# Patient Record
Sex: Female | Born: 1990 | Race: Black or African American | Hispanic: No | Marital: Single | Smoking: Never smoker
Health system: Southern US, Community
[De-identification: ages and names within clinical notes are randomized; demographics above are authoritative.]

---

## 2009-06-23 ENCOUNTER — Emergency Department (HOSPITAL_COMMUNITY): Admission: EM | Admit: 2009-06-23 | Discharge: 2009-06-23 | Payer: Self-pay | Admitting: Emergency Medicine

## 2013-12-27 ENCOUNTER — Emergency Department (HOSPITAL_COMMUNITY)
Admission: EM | Admit: 2013-12-27 | Discharge: 2013-12-27 | Disposition: A | Discharge status: Home / Self Care | Payer: No Typology Code available for payment source | Attending: Emergency Medicine | Admitting: Emergency Medicine

## 2013-12-27 ENCOUNTER — Emergency Department (HOSPITAL_COMMUNITY): Payer: No Typology Code available for payment source

## 2013-12-27 ENCOUNTER — Encounter (HOSPITAL_COMMUNITY): Payer: Self-pay | Admitting: Emergency Medicine

## 2013-12-27 DIAGNOSIS — Y93C2 Activity, hand held interactive electronic device: Secondary | ICD-10-CM | POA: Diagnosis not present

## 2013-12-27 DIAGNOSIS — S298XXA Other specified injuries of thorax, initial encounter: Secondary | ICD-10-CM | POA: Diagnosis present

## 2013-12-27 DIAGNOSIS — R0781 Pleurodynia: Secondary | ICD-10-CM

## 2013-12-27 NOTE — Discharge Instructions (Signed)
Please call your doctor for a followup appointment within 24-48 hours. When you talk to your doctor please let them know that you were seen in the emergency department and have them acquire all of your records so that they can discuss the findings with you and formulate a treatment plan to fully care for your new and ongoing problems. Please rest and stay hydrated Please call and set up an appointment with your primary care provider Please call and 7 appointment with orthopedics Please rest and avoid any physical or strenuous activity Please continue to monitor symptoms closely and if symptoms are to worsen or change (fever greater than 101, chills, sweating, nausea, vomiting, chest pain, shortness of breathe, difficulty breathing, weakness, numbness, tingling, worsening or changes to pain pattern, weakness, numbness, tingling, fall, injury, headache, dizziness, blurred vision, sudden loss of vision, worsening or changes to pain pattern in the ribs) please report back to the Emergency Department immediately.    Chest Wall Pain Chest wall pain is pain in or around the bones and muscles of your chest. It may take up to 6 weeks to get better. It may take longer if you must stay physically active in your work and activities.  CAUSES  Chest wall pain may happen on its own. However, it may be caused by:  A viral illness like the flu.  Injury.  Coughing.  Exercise.  Arthritis.  Fibromyalgia.  Shingles. HOME CARE INSTRUCTIONS   Avoid overtiring physical activity. Try not to strain or perform activities that cause pain. This includes any activities using your chest or your abdominal and side muscles, especially if heavy weights are used.  Put ice on the sore area.  Put ice in a plastic bag.  Place a towel between your skin and the bag.  Leave the ice on for 15-20 minutes per hour while awake for the first 2 days.  Only take over-the-counter or prescription medicines for pain, discomfort,  or fever as directed by your caregiver. SEEK IMMEDIATE MEDICAL CARE IF:   Your pain increases, or you are very uncomfortable.  You have a fever.  Your chest pain becomes worse.  You have new, unexplained symptoms.  You have nausea or vomiting.  You feel sweaty or lightheaded.  You have a cough with phlegm (sputum), or you cough up blood. MAKE SURE YOU:   Understand these instructions.  Will watch your condition.  Will get help right away if you are not doing well or get worse. Document Released: 04/11/2005 Document Revised: 07/04/2011 Document Reviewed: 12/06/2010 [facility] Patient Information 2015 [place], [place]. This information is not intended to replace advice given to you by your health care provider. Make sure you discuss any questions you have with your health care provider.   Emergency Department Resource Guide 1) Find a Doctor and Pay Out of Pocket Although you won't have to find out who is covered by your insurance plan, it is a good idea to ask around and get recommendations. You will then need to call the office and see if the doctor you have chosen will accept you as a new patient and what types of options they offer for patients who are self-pay. Some doctors offer discounts or will set up payment plans for their patients who do not have insurance, but you will need to ask so you aren't surprised when you get to your appointment.  2) Contact Your Local Health Department Not all health departments have doctors that can see patients for sick visits, but many  do, so it is worth a call to see if yours does. If you don't know where your local health department is, you can check in your phone book. The CDC also has a tool to help you locate your state's health department, and many state websites also have listings of all of their local health departments.  3) Find a Walk-in Clinic If your illness is not likely to be very severe or complicated, you may want to try a walk in  clinic. These are popping up all over the country in pharmacies, drugstores, and shopping centers. They're usually staffed by nurse practitioners or physician assistants that have been trained to treat common illnesses and complaints. They're usually fairly quick and inexpensive. However, if you have serious medical issues or chronic medical problems, these are probably not your best option.  No Primary Care Doctor: - Call Health Connect at  [phone] - they can help you locate a primary care doctor that  accepts your insurance, provides certain services, etc. - Physician Referral Service- [phone]  Chronic Pain Problems: Organization         Address  Phone   Notes  [facility]  [phone] Patients need to be referred by their primary care doctor.   Medication Assistance: Organization         Address  Phone   Notes  [facility] Medication [facility] [address] [place], [place] [address] [phone] --Must be a resident of [facility] -- Must have NO insurance coverage whatsoever (no Medicaid/ Medicare, etc.) -- The pt. MUST have a primary care doctor that directs their care regularly and follows them in the community   MedAssist  [phone]   [employer]  [phone]    Agencies that provide inexpensive medical care: Organization         Address  Phone   Notes  [facility]  [phone]   [facility]    [phone]   [facility] [address] [place], [place] [address] [phone]   [facility] [address], [place] [phone]   Planned Parenthood    [phone]   [facility]    [phone]   [facility] and [facility]  [address], [place] Phone:  [phone], Fax:  [phone] Hours of Operation:  9 am - 6 pm, M-F.  Also accepts Medicaid/Medicare and self-pay.  [facility]  [address], [place] Phone: [phone], Fax: [phone]. Hours of Operation:  8:30 am - 5:30 pm, M-F.  Also accepts Medicaid and self-pay.  [facility] [place] [address], [place] Phone: [phone]   [facility] [address] Natasha Bence [place], [place] [phone], Ext. [phone] Mondays & Thursdays: 7-9 AM.  First 15 patients are seen on a first come, first serve basis.    Medicaid-accepting [facility] [place] [facility] Providers:  Organization         Address  Phone   Notes  [facility] [address], [place] [phone] Also accepts self-pay patients.  [facility] [address] Laure[place] Josephs [place], [place]  [phone]   [facility] [address], [place] [phone]   [facility] [address], [place] [phone]   Renaye Rakers  [address], [place]   [phone] Only accepts [place] patients after they have their name applied to their card.   Self-Pay (no insurance) in [facility]:  Organization         Address  Phone   Notes  Sickle Cell Patients, [facility] [address] [place], [place] [phone]   [facility]gent Care [address] [place], [place] [phone]   [facility] [place]  [address], [place] [phone]   [facility]/Dr. Osei-Bonsu  [address], [place] or [address], [place] [phone] Phone number for both [place] and [place] locations is the same.  Urgent Medical and [facility] [address], [place] [phone]   [facility] [address] [place] or [address] [phone]   [facility] [address] [place] [phone], phone; [phone], fax Sees patients  1st and 3rd Saturday of every month.  Must not qualify for public or private insurance (i.e. Medicaid, Medicare, [place] Health Choice, Veterans' Benefits)  Household income should be no more than 200% of the poverty level The clinic cannot treat you if you are pregnant or think you are pregnant  Sexually transmitted diseases are not treated at the clinic.    Dental Care: Organization         Address  Phone  Notes  [facility] Department of [facility] [address] [place], [place] [phone] Accepts children up to age 20 who are enrolled in [place] or [place] Health Choice; pregnant women with a Medicaid card; and children who have applied for Medicaid or [place] Health Choice, but were declined, whose parents can pay a reduced fee at time of service.  [facility]  [address], [place] [phone] Accepts children up to age 28 who are enrolled in [place] or [place] Health Choice; pregnant women with a Medicaid card; and children who have applied for Medicaid or [place] Health Choice, but were declined, whose parents can pay a reduced fee at time of service.  [facility]  [address] [place], [place] [phone] Patients are seen by appointment only. Walk-ins are not accepted. [facility] will see patients 63 years of age and older. Monday - Tuesday (8am-5pm) Most Wednesdays (8:30-5pm) $30 per visit, cash only  [facility] Adult Dental Access [facility]  [address], [facility] [phone] Patients are seen by appointment only. Walk-ins are not accepted. [facility] will see patients 1 years of age and older. One Wednesday Evening (Monthly: Volunteer Based).  $30 per visit, cash only  [employer]  [phone] for adults; Children under age 4, call Graduate Pediatric Dentistry at [phone]. Children aged 68-14, please call [phone] to request a  pediatric application.  Dental services are provided in all areas of dental care including fillings, crowns and bridges, complete and partial dentures, implants, gum treatment, root canals, and extractions. Preventive care is also provided. Treatment is provided to both adults and children. Patients are selected via a lottery and there is often a waiting list.   [facility] [address] [place]  [phone] [email or web address]   [facility] [address] [place], [place] [phone], Ext. [phone] Second  and Fourth Thursday of each month, opens at 6:30 AM; Clinic ends at 9 AM.  Patients are seen on a first-come first-served basis, and a limited number are seen during each clinic.   [facility]  [address] [place], [place] [phone]   Eligibility Requirements You must have lived in [place], [place], or [place] counties for at least the last three months.   You cannot be eligible for state or federal sponsored [place], including [employer], [place], or [employer].   You generally cannot be eligible for healthcare insurance through your employer.    How to apply: Eligibility screenings are held every Tuesday and Wednesday afternoon from 1:00 pm until 4:00 pm. You do not need an appointment for the interview!  [facility] [address], [place], [place] [phone]   [facility]  [phone]   [facility] Health Department  [phone]   [facility] Health Department  [phone]    Behavioral Health Resources in the Community: Intensive Outpatient Programs Organization         Address  Phone  Notes  [facility] [address] [place], [place] [phone]   [facility] Outpatient [address] [place], [place] [phone]   [facility] [address], [place], [place]  [phone]   [facility]  Mental Health [address]  [place], [place] [phone] or [phone]   Substance Abuse Resources Organization         Address  Phone  Notes  [facility]  [phone]   [facility]  [phone]   [facility]  [phone]   Floydene Flock  [phone]   Residential & Outpatient Substance Abuse Program  [phone]   Psychological Services Organization         Address  Phone  Notes  [facility] Behavioral Health  [phone]   [facility] Services  [phone]   [facility] Mental Health [address], [place] [phone] or [phone]    Mobile Crisis Teams Organization         Address  Phone  Notes  [facility], Mobile Crisis Care Unit  [phone]   [facility]  [address] [place], [place] [phone]   Doristine Locks [address] [place] [phone]    Self-Help/Support Groups Organization         Address  Phone             Notes  [facility] [place] - variety of support groups  336- I7437963 Call for more information  Narcotics Anonymous (NA), [facility] [address], [employer] [place]  2 meetings at this location   Statistician         Address  Phone  Notes  [facility] [address],    [place]  [phone]   [facility]  [address] [place] [address], [place], [place] [phone]   [facility] [address] [place], [place] [phone] Admissions: 8am-3pm M-F  [facility] [address]    [place], [place] [phone]   [facility] [address] Starling Manns [place], [place] [phone]   [facility] [address],  [place], [place] [phone]   [facility] - Intensive Outpatient [address] Laurell [address], [place], [place] [phone]   [facility] ([facility] [phone] [place]  Vanessa Dayton    [place], [place] [phone] or [phone]   [facility] ([facility]) [address] [place], [place] [phone] Accepts Medicaid  [facility] [address],  [place] [phone] Substance Abuse/Addiction Treatment   [facility] Organization         Address  Phone  Notes  [facility]  [phone]   Angie Fava, PhD [address] [place]vin Knack [place], [place]   [phone] or [phone]   [facility] Behavioral   [address] [place], [place] [phone]   [facility] [address], [place], [place] [phone] Insurance/Medicaid/sponsorship through [facility] and Families [address]                                    [place], [place] [phone] Therapy/tele-psych/case  [facility] [address], [place] [phone]    Dr. Lolly Mustache  [phone]   [facility] [place]  [employer] [facility] Dept. 1) [address] [place] 2) [address], [place] 3)  [address], [place] [phone]  [phone]   [facility] Child Abuse Hotline 614-085-6193 or [phone] (After Hours)
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
## 2013-12-27 NOTE — ED Notes (Signed)
Xray called regarding delay in xray; tech coming to get pt.
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
## 2013-12-27 NOTE — ED Notes (Signed)
PA at bedside.

## 2013-12-27 NOTE — ED Notes (Signed)
Patient transported to X-ray 

## 2013-12-27 NOTE — ED Notes (Signed)
PT ambulated with baseline gait; VSS; A&Ox3; no signs of distress; respirations even and unlabored; skin warm and dry; no questions upon discharge.  

## 2013-12-28 NOTE — ED Provider Notes (Signed)
Medical screening examination/treatment/procedure(s) were performed by non-physician practitioner and as supervising physician I was immediately available for consultation/collaboration.   EKG Interpretation None       Carter Kassel K Linker, MD 12/28/13 1505 

## 2014-01-11 NOTE — ED Provider Notes (Signed)
Medical screening examination/treatment/procedure(s) were performed by non-physician practitioner and as supervising physician I was immediately available for consultation/collaboration.  Audree Camel, MD 01/11/14 [phone]
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
